# Patient Record
Sex: Female | Born: 1980 | Race: White | Hispanic: No | Marital: Married | Smoking: Never smoker
Health system: Southern US, Community
[De-identification: ages and names within clinical notes are randomized; demographics above are authoritative.]

## PROBLEM LIST (undated history)

## (undated) DIAGNOSIS — R1013 Epigastric pain: Secondary | ICD-10-CM

## (undated) DIAGNOSIS — K219 Gastro-esophageal reflux disease without esophagitis: Secondary | ICD-10-CM

## (undated) DIAGNOSIS — R079 Chest pain, unspecified: Secondary | ICD-10-CM

## (undated) DIAGNOSIS — R519 Headache, unspecified: Secondary | ICD-10-CM

## (undated) HISTORY — DX: Epigastric pain: R10.13

## (undated) HISTORY — DX: Gastro-esophageal reflux disease without esophagitis: K21.9

## (undated) HISTORY — DX: Headache, unspecified: R51.9

## (undated) HISTORY — DX: Chest pain, unspecified: R07.9

---

## 2000-10-15 ENCOUNTER — Other Ambulatory Visit: Admission: RE | Admit: 2000-10-15 | Discharge: 2000-10-15 | Payer: Self-pay | Admitting: Obstetrics and Gynecology

## 2001-11-16 ENCOUNTER — Other Ambulatory Visit: Admission: RE | Admit: 2001-11-16 | Discharge: 2001-11-16 | Payer: Self-pay | Admitting: Obstetrics and Gynecology

## 2003-02-01 ENCOUNTER — Other Ambulatory Visit: Admission: RE | Admit: 2003-02-01 | Discharge: 2003-02-01 | Payer: Self-pay | Admitting: Obstetrics and Gynecology

## 2004-05-24 ENCOUNTER — Other Ambulatory Visit: Admission: RE | Admit: 2004-05-24 | Discharge: 2004-05-24 | Payer: Self-pay | Admitting: Obstetrics and Gynecology

## 2004-06-24 ENCOUNTER — Ambulatory Visit (HOSPITAL_COMMUNITY): Admission: RE | Admit: 2004-06-24 | Discharge: 2004-06-24 | Payer: Self-pay | Admitting: Obstetrics and Gynecology

## 2005-04-08 ENCOUNTER — Inpatient Hospital Stay (HOSPITAL_COMMUNITY): Admission: AD | Admit: 2005-04-08 | Discharge: 2005-04-10 | Payer: Self-pay | Admitting: Obstetrics and Gynecology

## 2005-04-11 ENCOUNTER — Encounter: Admission: RE | Admit: 2005-04-11 | Discharge: 2005-04-22 | Payer: Self-pay | Admitting: Obstetrics and Gynecology

## 2005-05-20 ENCOUNTER — Other Ambulatory Visit: Admission: RE | Admit: 2005-05-20 | Discharge: 2005-05-20 | Payer: Self-pay | Admitting: Obstetrics and Gynecology

## 2007-01-02 ENCOUNTER — Inpatient Hospital Stay (HOSPITAL_COMMUNITY): Admission: AD | Admit: 2007-01-02 | Discharge: 2007-01-04 | Payer: Self-pay | Admitting: Obstetrics and Gynecology

## 2007-10-06 ENCOUNTER — Encounter: Admission: RE | Admit: 2007-10-06 | Discharge: 2007-10-06 | Payer: Self-pay | Admitting: Obstetrics and Gynecology

## 2009-04-04 ENCOUNTER — Inpatient Hospital Stay (HOSPITAL_COMMUNITY): Admission: AD | Admit: 2009-04-04 | Discharge: 2009-04-04 | Payer: Self-pay | Admitting: Obstetrics and Gynecology

## 2009-08-09 ENCOUNTER — Inpatient Hospital Stay (HOSPITAL_COMMUNITY): Admission: AD | Admit: 2009-08-09 | Discharge: 2009-08-12 | Payer: Self-pay | Admitting: Obstetrics and Gynecology

## 2010-05-20 LAB — CBC
HCT: 30.6 % — ABNORMAL LOW (ref 36.0–46.0)
Hemoglobin: 10.6 g/dL — ABNORMAL LOW (ref 12.0–15.0)
MCHC: 34.4 g/dL (ref 30.0–36.0)
RBC: 3.56 MIL/uL — ABNORMAL LOW (ref 3.87–5.11)
RBC: 4.22 MIL/uL (ref 3.87–5.11)
RDW: 13.8 % (ref 11.5–15.5)
WBC: 11.3 10*3/uL — ABNORMAL HIGH (ref 4.0–10.5)

## 2010-05-20 LAB — RPR: RPR Ser Ql: NONREACTIVE

## 2010-12-10 LAB — CBC
HCT: 40.4
Hemoglobin: 13.9
MCHC: 33.6
MCHC: 34.5
MCV: 86.2
RBC: 4.02
RDW: 13
WBC: 9.8

## 2010-12-10 LAB — RPR: RPR Ser Ql: NONREACTIVE

## 2012-11-26 ENCOUNTER — Ambulatory Visit
Admission: RE | Admit: 2012-11-26 | Discharge: 2012-11-26 | Disposition: A | Discharge status: Home / Self Care | Payer: BC Managed Care – PPO | Source: Ambulatory Visit | Attending: Obstetrics and Gynecology | Admitting: Obstetrics and Gynecology

## 2012-11-26 ENCOUNTER — Other Ambulatory Visit: Payer: Self-pay | Admitting: Obstetrics and Gynecology

## 2012-11-26 DIAGNOSIS — R928 Other abnormal and inconclusive findings on diagnostic imaging of breast: Secondary | ICD-10-CM

## 2015-03-14 ENCOUNTER — Other Ambulatory Visit (HOSPITAL_COMMUNITY): Payer: Self-pay | Admitting: Family Medicine

## 2015-03-14 DIAGNOSIS — R1011 Right upper quadrant pain: Secondary | ICD-10-CM

## 2015-04-04 ENCOUNTER — Ambulatory Visit (HOSPITAL_COMMUNITY)
Admission: RE | Admit: 2015-04-04 | Discharge: 2015-04-04 | Disposition: A | Discharge status: Home / Self Care | Payer: BLUE CROSS/BLUE SHIELD | Source: Ambulatory Visit | Attending: Family Medicine | Admitting: Family Medicine

## 2015-04-04 DIAGNOSIS — R1011 Right upper quadrant pain: Secondary | ICD-10-CM | POA: Diagnosis not present

## 2015-04-04 MED ORDER — TECHNETIUM TC 99M MEBROFENIN IV KIT
5.0000 | PACK | Freq: Once | INTRAVENOUS | Status: AC | PRN
  Administered 2015-04-04: 5 via INTRAVENOUS

## 2015-12-12 ENCOUNTER — Telehealth: Payer: Self-pay | Admitting: Genetic Counselor

## 2015-12-12 ENCOUNTER — Encounter: Payer: Self-pay | Admitting: Genetic Counselor

## 2015-12-12 NOTE — Telephone Encounter (Signed)
Pt cld wanting to set up an appt w/genetic counselor. Appt scheduled for 11/16 at 9am w/Kayla Boggs. Demographics verified. Pt aware to arrive 30 minutes early. Dr. Gaye AlkenMccombs office from [facility] will fax records. Letter mailed to the pt.

## 2016-01-17 ENCOUNTER — Ambulatory Visit (HOSPITAL_BASED_OUTPATIENT_CLINIC_OR_DEPARTMENT_OTHER): Payer: BLUE CROSS/BLUE SHIELD | Admitting: Genetic Counselor

## 2016-01-17 ENCOUNTER — Other Ambulatory Visit: Payer: BLUE CROSS/BLUE SHIELD

## 2016-01-17 DIAGNOSIS — Z803 Family history of malignant neoplasm of breast: Secondary | ICD-10-CM | POA: Diagnosis not present

## 2016-01-17 DIAGNOSIS — Z808 Family history of malignant neoplasm of other organs or systems: Secondary | ICD-10-CM

## 2016-01-17 DIAGNOSIS — Z1379 Encounter for other screening for genetic and chromosomal anomalies: Secondary | ICD-10-CM | POA: Diagnosis not present

## 2016-01-17 DIAGNOSIS — Z8049 Family history of malignant neoplasm of other genital organs: Secondary | ICD-10-CM

## 2016-01-17 DIAGNOSIS — Z800 Family history of malignant neoplasm of digestive organs: Secondary | ICD-10-CM

## 2016-01-17 DIAGNOSIS — Z8481 Family history of carrier of genetic disease: Secondary | ICD-10-CM | POA: Diagnosis not present

## 2016-01-17 DIAGNOSIS — Z801 Family history of malignant neoplasm of trachea, bronchus and lung: Secondary | ICD-10-CM

## 2016-01-17 DIAGNOSIS — Z8041 Family history of malignant neoplasm of ovary: Secondary | ICD-10-CM | POA: Diagnosis not present

## 2016-01-18 ENCOUNTER — Encounter: Payer: Self-pay | Admitting: Genetic Counselor

## 2016-01-18 NOTE — Progress Notes (Signed)
REFERRING PROVIDER: Arvella Nigh, MD [address], [place] [address]  PRIMARY PROVIDER:  Melinda Crutch, MD  PRIMARY REASON FOR VISIT:  1. Family history of BRCA1 gene positive   2. Family history of breast cancer in female   3. Family history of ovarian cancer   4. Family history of melanoma   5. Family history of colon cancer   6. Family history of cervical cancer   7. Family history of lung cancer      HISTORY OF PRESENT ILLNESS:   Rebecca Crawford. Rebecca Crawford, a 35 y.o. female, was seen for a [place] cancer genetics consultation at the request of Dr. Radene Knee due to a family history of a known pathogenic BRCA1 gene mutation and family history of breast, ovarian, and other cancers.  Rebecca Crawford presents to clinic today to discuss the possibility of a hereditary predisposition to cancer, genetic testing, and to further clarify her future cancer risks, as well as potential cancer risks for family members.    Rebecca Crawford is a 35 y.o. female with no personal history of cancer.     HORMONAL RISK FACTORS:  Menarche was at age 26.  First live birth at age [age].  OCP use for approximately 10 years.  Ovaries intact: yes.  Hysterectomy: no.  Menopausal status: premenopausal.  HRT use: used 3 or fewer cycles of clomid for two of her pregnancies. Colonoscopy: no; not examined. Mammogram within the last year: yes. Number of breast biopsies: 0. Up to date with pelvic exams:  yes. Any excessive radiation exposure/other exposures in the past:  no  History reviewed. No pertinent past medical history.  History reviewed. No pertinent surgical history.  Social History   Social History  . Marital status: Married    Spouse name: N/A  . Number of children: N/A  . Years of education: N/A   Social History Main Topics  . Smoking status: Never Smoker  . Smokeless tobacco: Never Used  . Alcohol use No  . Drug use: Unknown  . Sexual activity: Not Asked   Other Topics Concern  . None    Social History Narrative  . None     FAMILY HISTORY:  We obtained a detailed, 4-generation family history.  Significant diagnoses are listed below: Family History  Problem Relation Age of Onset  . Ovarian cancer Mother 79    fallopian tube ca; s/p TAH-BSO  . Other Mother     hx of "skin patches" removed from back, no reported skin cancers  . BRCA 1/2 Mother     BRCA1+  . Other Father     tested negative for paternal known familial BRCA1 mutation  . Mental illness Maternal Uncle   . Breast cancer Paternal Aunt 37  . BRCA 1/2 Paternal Aunt     BRCA1+ (different mutation than on maternal side of family)  . Cervical cancer Maternal Grandmother 46    s/p TAH-BSO  . Colon cancer Maternal Grandmother 2  . Melanoma Maternal Grandmother 76  . Basal cell carcinoma Maternal Grandmother     multiple  . Lung cancer Maternal Grandfather 29    lung/bronchus; +tobacco  . Breast cancer Paternal Grandmother 72  . BRCA 1/2 Paternal Grandmother     BRCA1+  . BRCA 1/2 Sister     BRCA1+  . Cancer Other     either bladder or prostate cancer; d. 90y  . Breast cancer Cousin 42    maternal 1st cousin, once-removed  . BRCA 1/2 Cousin  BRCA1+  . Other Cousin     hx of normal uterine biopsy  . Breast cancer Other     paternal great grandmother (PGM's mother); dx unspecied age    Rebecca Crawford has three daughters, ages 37-10 years.  She has three full sisters, ages 49-40.  Her youngest sister is the only one to have yet had genetic testing for the maternal known familial BRCA1 gene mutation, "[id number]"; she tested positive for this mutation.  Rebecca Crawford mother is currently 44.  She was diagnosed with cancer of the fallopian tube recently and underwent genetic testing via a 42-gene [facility] Common Hereditary Cancers Panel (Breast, Gyn, GI) in [place].  She was the first maternal family member found to have this pathogenic BRCA1 mutation, "[id number]".  Rebecca Crawford father is 15  and has never had cancer.  His sister was also previously found to have pathogenic BRCA1 gene mutation called "[id number]", and he has since tested negative for this mutation.  Rebecca Crawford mother has one full brother.  He has mental health issues, and their family is not currently in contact with him and they have no health information for him.  Rebecca Crawford maternal grandmother is currently 45.  She was diagnosed with cervical cancer at 39, with colon cancer at 78, and with melanoma at age 75.  She also has a history of multiple basal cell carcinomas.  She underwent a TAH-BSO at the time of her cervical cancer diagnosis.  Rebecca Crawford maternal grandmother has three full brothers, all of whom have passed away.  One brother had a history of either a bladder or prostate cancer and passed away at 73.  One brother died in a car accident at 71.  The third brother died of heart disease.  This last brother had a daughter who was diagnosed with breast cancer at 66.  Rebecca Crawford maternal grandfather was diagnosed with lung/bronchus cancer at 64; he was a tobacco user.  He passed away at 19.  He had two full sisters, neither of whom have had cancer to Rebecca Crawford's family's knowledge.    Rebecca Crawford father has a fraternal twin brother who has never had cancer.  His only sister, Darrick Penna, is currently 12 and was diagnosed with breast cancer at 22.  She has tested positive for the BRCA1 gene mutation.  She has one daughter and three sons.  Her daughter is the only one of her children to have had genetic testing yet, and she has also tested positive for this mutation.  Rebecca Crawford other paternal uncle is currently 41 and has not had cancer.  Rebecca Crawford paternal grandmother is currently 29.  She was diagnosed with breast cancer at 48 and has tested positive for the BRCA1 mutation.  She had one sister who was diagnosed with breast cancer in her 51s.  Her mother also had breast cancer.  Rebecca Crawford paternal  grandfather is currently 21 and has never had cancer.    Patient's maternal and paternal ancestors are of [place] descent. There is no reported Ashkenazi Jewish ancestry. There is no known consanguinity.  GENETIC COUNSELING ASSESSMENT: Rebecca Crawford is a 35 y.o. female with a maternal family history of a pathogenic BRCA1 gene mutation, "[id number]".  We, therefore, discussed and recommended the following at today's visit.   DISCUSSION: We reviewed the characteristics, features and inheritance pattern of hereditary breast and ovarian cancer syndrome. We discussed that Rebecca Crawford has a 50% of having inherited the maternal BRCA1 mutation.  On the  other hand, she could not have inherited the paternal family BRCA1 mutation, as her father tested negative for this gene mutation previously identified in his sister.  We also discussed genetic testing, including the appropriate family members to test, the process of testing, insurance coverage and turn-around-time for results. We discussed the implications of a negative, positive and/or variant of uncertain significant result. We discussed testing options through [facility].  [facility] offers free genetic testing to first-degree relatives' of probands who test positive for a pathogenic or likely pathogenic gene mutation within 90 days of the original family member's test report.  This testing includes analysis of the whole gene--(i.e. full analysis of BRCA1).  We also discussed testing via the 30-gene Hereditary Cancer Panel through [facility] for $60 (which includes analysis of BRCA1 plus 29 other genes).  Rebecca Crawford would like to proceed with free family testing through [facility].  Thus, we recommended Rebecca Crawford. Tauzin pursue sequencing and deletion/duplication analysis of the BRCA1 gene through [facility] [place], [place]).  Based on Rebecca Crawford. Behanna's family  history of cancer, she meets medical criteria for genetic testing. We discussed that she will have no cost for testing because she meets [facility] criteria for free family testing.  She is encouraged to call us if she receives a bill, as this bill will have been sent in error.   PLAN: After considering the risks, benefits, and limitations, Rebecca Crawford. Glynn  provided informed consent to pursue genetic testing and the blood sample was sent to [facility] for full gene analysis of the BRCA1 gene. Results should be available within approximately 2-3 weeks' time, at which point they will be disclosed by telephone to Rebecca Crawford. Gaillard, as will any additional recommendations warranted by these results. Rebecca Crawford. Handley will receive a summary of her genetic counseling visit and a copy of her results once available. This information will also be available in Epic. We encouraged Rebecca Crawford. Achey to remain in contact with cancer genetics annually so that we can continuously update the family history and inform her of any changes in cancer genetics and testing that may be of benefit for her family. Rebecca Crawford. Jolley questions were answered to her satisfaction today. Our contact information was provided should additional questions or concerns arise.  Thank you for the referral and allowing [place] to share in the care of your patient.   Jeanine Luz, Rebecca Crawford, [facility] Certified Genetic Counselor [email or web address] Phone: [phone]  The patient was seen for a total of 60 minutes in face-to-face genetic counseling.  This patient was discussed with Drs. Magrinat, Lindi Adie and/or Burr Medico who agrees with the above.    _______________________________________________________________________ For Office Staff:  Number of people involved in session: 2 Was an Intern/ student involved with case: no

## 2016-01-31 ENCOUNTER — Telehealth: Payer: Self-pay | Admitting: Genetic Counselor

## 2016-01-31 NOTE — Telephone Encounter (Signed)
Ms. Rebecca Crawford called to check if her results are back yet.  We ordered testing on 11/16, and they are not yet back.  Discussed that it can take up to 3 weeks.  If they are not back tomorrow, I will call the lab and get an estimated TAT from them and then I'll call her and let her know.

## 2016-02-01 ENCOUNTER — Telehealth: Payer: Self-pay | Admitting: Genetic Counselor

## 2016-02-01 NOTE — Telephone Encounter (Signed)
Followed up with Ms. Rebecca Crawford regarding when we might expect test results back.  Called [facility] and they let me know that those will be back by 12/11 at the latest, but possibly by 12/8.  She is welcome to call with any other questions.

## 2016-02-11 ENCOUNTER — Telehealth: Payer: Self-pay | Admitting: Genetic Counselor

## 2016-02-11 DIAGNOSIS — Z1329 Encounter for screening for other suspected endocrine disorder: Secondary | ICD-10-CM | POA: Diagnosis not present

## 2016-02-11 DIAGNOSIS — Z13220 Encounter for screening for lipoid disorders: Secondary | ICD-10-CM | POA: Diagnosis not present

## 2016-02-11 DIAGNOSIS — Z13228 Encounter for screening for other metabolic disorders: Secondary | ICD-10-CM | POA: Diagnosis not present

## 2016-02-11 DIAGNOSIS — Z1231 Encounter for screening mammogram for malignant neoplasm of breast: Secondary | ICD-10-CM | POA: Diagnosis not present

## 2016-02-11 DIAGNOSIS — Z8041 Family history of malignant neoplasm of ovary: Secondary | ICD-10-CM | POA: Diagnosis not present

## 2016-02-11 DIAGNOSIS — Z01419 Encounter for gynecological examination (general) (routine) without abnormal findings: Secondary | ICD-10-CM | POA: Diagnosis not present

## 2016-02-11 DIAGNOSIS — Z6829 Body mass index (BMI) 29.0-29.9, adult: Secondary | ICD-10-CM | POA: Diagnosis not present

## 2016-02-11 NOTE — Telephone Encounter (Signed)
Discussed that Rebecca Crawford's genetic test result was negative for the known familial pathogenic mutation previously identified in the BRCA1 gene for her mother and sister.  Discussed that we call this a true negative result and that her risks for BRCA1-related cancers are likely more similar to that of the general population.  Recommended that her two sisters and her other maternal relatives still undergo genetic testing.  Rebecca Crawford is very happy to receive this news--she has been very anxious about getting her test results back.

## 2016-02-12 ENCOUNTER — Ambulatory Visit: Payer: Self-pay | Admitting: Genetic Counselor

## 2016-02-12 DIAGNOSIS — Z1379 Encounter for other screening for genetic and chromosomal anomalies: Secondary | ICD-10-CM

## 2016-02-12 DIAGNOSIS — Z803 Family history of malignant neoplasm of breast: Secondary | ICD-10-CM

## 2016-02-12 DIAGNOSIS — Z8041 Family history of malignant neoplasm of ovary: Secondary | ICD-10-CM

## 2016-02-12 DIAGNOSIS — Z8481 Family history of carrier of genetic disease: Secondary | ICD-10-CM

## 2016-02-12 DIAGNOSIS — Z809 Family history of malignant neoplasm, unspecified: Secondary | ICD-10-CM

## 2016-02-18 DIAGNOSIS — Z1379 Encounter for other screening for genetic and chromosomal anomalies: Secondary | ICD-10-CM | POA: Insufficient documentation

## 2016-02-18 NOTE — Progress Notes (Signed)
GENETIC TEST RESULT  HPI: Rebecca Crawford was previously seen in the [place] clinic due to a family history of a known pathogenic BRCA1 gene mutation, family history of breast, ovarian, and other cancers, and concerns regarding a hereditary predisposition to cancer. Please refer to our prior cancer genetics clinic note from January 17, 2016 for more information regarding Rebecca Crawford's medical, social and family histories, and our assessment and recommendations, at the time. Rebecca Crawford recent genetic test results were disclosed to her, as were recommendations warranted by these results. These results and recommendations are discussed in more detail below.  GENETIC TEST RESULTS: Genetic testing reported out on February 10, 2016 through the full sequencing and deletion/duplicaton analysis of the BRCA1 found no deleterious mutations.  No variants of uncertain significance (VUSes) were found.  Testing was performed via [facility], [place]).  The test report will be scanned into EPIC and will be located under the Molecular Pathology section of the Results Review tab.   Our concern in recommending Rebecca Crawford pursue testing was due to the known familial pathogenic BRCA1 gene mutation called "[id number] ([id number])", that was previously identified in Rebecca Crawford's mother and sister. Rebecca Crawford test was normal and did not reveal the familial mutation. We call this result a true negative result because the cancer-causing mutation was identified in Rebecca Crawford's family, and she did not inherit it.  Given this negative result, Rebecca Crawford chances of developing BRCA1-related cancers are the same as they are in the general population.    CANCER SCREENING RECOMMENDATIONS: This normal result is reassuring and indicates that Rebecca Crawford does not likely have an increased risk of cancer due to a mutation in the BRCA1 gene.  We, therefore, recommended  Rebecca Crawford  continue to follow the cancer screening guidelines provided by her primary healthcare providers.   RECOMMENDATIONS FOR FAMILY MEMBERS: Women in this family might be at some increased risk of developing cancer, over the general population risk, simply due to the family history of cancer. We recommended women in this family have a yearly mammogram beginning at age 31, or 93 years younger than the earliest onset of cancer, an annual clinical breast exam, and perform monthly breast self-exams. Women in this family should also have a gynecological exam as recommended by their primary provider. Men in the family should discuss prostate cancer screening with their doctors.  All family members should have a colonoscopy by age 21.  Family members who test positive for the familial BRCA1 mutation are at a higher risk for certain cancers and will need to follow [place] (NCCN) guidelines for [employer].    Based on Rebecca Crawford's family history, we recommended her other two sisters and her other maternal relatives, who have not yet had genetic counseling and testing, do so. Rebecca Crawford will let us know if we can be of any assistance in coordinating genetic counseling and/or testing for these family members.   FOLLOW-UP: Lastly, we discussed with Rebecca Crawford that cancer genetics is a rapidly advancing field and it is possible that new genetic tests will be appropriate for her and/or her family members in the future. We encouraged her to remain in contact with cancer genetics on an annual basis so we can update her personal and family histories and let her know of advances in cancer genetics that may benefit this family.   Our contact number was provided. Rebecca Crawford questions were answered to her satisfaction, and she  knows she is welcome to call us at anytime with additional questions or concerns.   Jeanine Luz, MS, [facility] Certified Genetic  Counselor [email or web address] Phone: [phone]

## 2016-04-09 DIAGNOSIS — E559 Vitamin D deficiency, unspecified: Secondary | ICD-10-CM | POA: Diagnosis not present

## 2017-03-02 DIAGNOSIS — Z01419 Encounter for gynecological examination (general) (routine) without abnormal findings: Secondary | ICD-10-CM | POA: Diagnosis not present

## 2017-03-02 DIAGNOSIS — Z1231 Encounter for screening mammogram for malignant neoplasm of breast: Secondary | ICD-10-CM | POA: Diagnosis not present

## 2017-03-02 DIAGNOSIS — Z6831 Body mass index (BMI) 31.0-31.9, adult: Secondary | ICD-10-CM | POA: Diagnosis not present

## 2017-03-19 DIAGNOSIS — Z809 Family history of malignant neoplasm, unspecified: Secondary | ICD-10-CM | POA: Diagnosis not present

## 2017-03-19 DIAGNOSIS — Z8041 Family history of malignant neoplasm of ovary: Secondary | ICD-10-CM | POA: Diagnosis not present

## 2017-03-19 DIAGNOSIS — Z803 Family history of malignant neoplasm of breast: Secondary | ICD-10-CM | POA: Diagnosis not present

## 2017-06-02 DIAGNOSIS — M26621 Arthralgia of right temporomandibular joint: Secondary | ICD-10-CM | POA: Diagnosis not present

## 2017-08-21 ENCOUNTER — Ambulatory Visit
Admission: RE | Admit: 2017-08-21 | Discharge: 2017-08-21 | Disposition: A | Discharge status: Home / Self Care | Payer: BLUE CROSS/BLUE SHIELD | Source: Ambulatory Visit | Attending: Family Medicine | Admitting: Family Medicine

## 2017-08-21 ENCOUNTER — Other Ambulatory Visit: Payer: Self-pay | Admitting: Family Medicine

## 2017-08-21 DIAGNOSIS — M25579 Pain in unspecified ankle and joints of unspecified foot: Secondary | ICD-10-CM

## 2017-08-21 DIAGNOSIS — M25571 Pain in right ankle and joints of right foot: Secondary | ICD-10-CM | POA: Diagnosis not present

## 2017-08-21 DIAGNOSIS — S99911A Unspecified injury of right ankle, initial encounter: Secondary | ICD-10-CM | POA: Diagnosis not present

## 2018-01-05 DIAGNOSIS — F4323 Adjustment disorder with mixed anxiety and depressed mood: Secondary | ICD-10-CM | POA: Diagnosis not present

## 2018-01-13 DIAGNOSIS — F4323 Adjustment disorder with mixed anxiety and depressed mood: Secondary | ICD-10-CM | POA: Diagnosis not present

## 2018-01-20 DIAGNOSIS — F4323 Adjustment disorder with mixed anxiety and depressed mood: Secondary | ICD-10-CM | POA: Diagnosis not present

## 2018-01-26 DIAGNOSIS — F4323 Adjustment disorder with mixed anxiety and depressed mood: Secondary | ICD-10-CM | POA: Diagnosis not present

## 2018-02-04 DIAGNOSIS — E329 Disease of thymus, unspecified: Secondary | ICD-10-CM | POA: Diagnosis not present

## 2018-09-01 DIAGNOSIS — Z803 Family history of malignant neoplasm of breast: Secondary | ICD-10-CM | POA: Diagnosis not present

## 2018-09-01 DIAGNOSIS — Z6832 Body mass index (BMI) 32.0-32.9, adult: Secondary | ICD-10-CM | POA: Diagnosis not present

## 2018-09-01 DIAGNOSIS — Z13220 Encounter for screening for lipoid disorders: Secondary | ICD-10-CM | POA: Diagnosis not present

## 2018-09-01 DIAGNOSIS — Z1329 Encounter for screening for other suspected endocrine disorder: Secondary | ICD-10-CM | POA: Diagnosis not present

## 2018-09-01 DIAGNOSIS — Z8041 Family history of malignant neoplasm of ovary: Secondary | ICD-10-CM | POA: Diagnosis not present

## 2018-09-01 DIAGNOSIS — Z01419 Encounter for gynecological examination (general) (routine) without abnormal findings: Secondary | ICD-10-CM | POA: Diagnosis not present

## 2018-09-01 DIAGNOSIS — Z1231 Encounter for screening mammogram for malignant neoplasm of breast: Secondary | ICD-10-CM | POA: Diagnosis not present

## 2018-09-01 DIAGNOSIS — Z13228 Encounter for screening for other metabolic disorders: Secondary | ICD-10-CM | POA: Diagnosis not present

## 2018-09-09 DIAGNOSIS — Z8041 Family history of malignant neoplasm of ovary: Secondary | ICD-10-CM | POA: Diagnosis not present

## 2018-09-13 DIAGNOSIS — R194 Change in bowel habit: Secondary | ICD-10-CM | POA: Diagnosis not present

## 2018-09-13 DIAGNOSIS — R1084 Generalized abdominal pain: Secondary | ICD-10-CM | POA: Diagnosis not present

## 2018-09-13 DIAGNOSIS — Z713 Dietary counseling and surveillance: Secondary | ICD-10-CM | POA: Diagnosis not present

## 2018-09-14 ENCOUNTER — Encounter: Payer: Self-pay | Admitting: Gastroenterology

## 2018-09-29 DIAGNOSIS — N12 Tubulo-interstitial nephritis, not specified as acute or chronic: Secondary | ICD-10-CM | POA: Diagnosis not present

## 2018-10-06 DIAGNOSIS — Z20828 Contact with and (suspected) exposure to other viral communicable diseases: Secondary | ICD-10-CM | POA: Diagnosis not present

## 2018-10-12 ENCOUNTER — Emergency Department (HOSPITAL_BASED_OUTPATIENT_CLINIC_OR_DEPARTMENT_OTHER)
Admission: EM | Admit: 2018-10-12 | Discharge: 2018-10-12 | Disposition: A | Discharge status: Home / Self Care | Payer: BC Managed Care – PPO | Attending: Emergency Medicine | Admitting: Emergency Medicine

## 2018-10-12 ENCOUNTER — Emergency Department (HOSPITAL_COMMUNITY): Payer: BC Managed Care – PPO

## 2018-10-12 ENCOUNTER — Other Ambulatory Visit: Payer: Self-pay

## 2018-10-12 ENCOUNTER — Encounter (HOSPITAL_BASED_OUTPATIENT_CLINIC_OR_DEPARTMENT_OTHER): Payer: Self-pay | Admitting: *Deleted

## 2018-10-12 ENCOUNTER — Emergency Department (HOSPITAL_BASED_OUTPATIENT_CLINIC_OR_DEPARTMENT_OTHER): Payer: BC Managed Care – PPO

## 2018-10-12 DIAGNOSIS — R110 Nausea: Secondary | ICD-10-CM | POA: Diagnosis not present

## 2018-10-12 DIAGNOSIS — R42 Dizziness and giddiness: Secondary | ICD-10-CM | POA: Insufficient documentation

## 2018-10-12 LAB — BASIC METABOLIC PANEL
Anion gap: 11 (ref 5–15)
BUN: 8 mg/dL (ref 6–20)
CO2: 23 mmol/L (ref 22–32)
Calcium: 9.2 mg/dL (ref 8.9–10.3)
Chloride: 106 mmol/L (ref 98–111)
Creatinine, Ser: 0.67 mg/dL (ref 0.44–1.00)
GFR calc Af Amer: >60 mL/min (ref >60)
GFR calc non Af Amer: >60 mL/min (ref >60)
Glucose, Bld: 95 mg/dL (ref 70–99)
Potassium: 3.7 mmol/L (ref 3.5–5.1)
Sodium: 140 mmol/L (ref 135–145)

## 2018-10-12 LAB — CBC WITH DIFFERENTIAL/PLATELET
Abs Immature Granulocytes: 0.02 10*3/uL (ref 0.00–0.07)
Basophils Absolute: 0.1 10*3/uL (ref 0.0–0.1)
Basophils Relative: 1 %
Eosinophils Absolute: 0.1 10*3/uL (ref 0.0–0.5)
Eosinophils Relative: 1 %
HCT: 44.5 % (ref 36.0–46.0)
Hemoglobin: 15 g/dL (ref 12.0–15.0)
Immature Granulocytes: 0 %
Lymphocytes Relative: 27 %
Lymphs Abs: 2.1 10*3/uL (ref 0.7–4.0)
MCH: 29.1 pg (ref 26.0–34.0)
MCHC: 33.7 g/dL (ref 30.0–36.0)
MCV: 86.2 fL (ref 80.0–100.0)
Monocytes Absolute: 0.5 10*3/uL (ref 0.1–1.0)
Monocytes Relative: 7 %
Neutro Abs: 5 10*3/uL (ref 1.7–7.7)
Neutrophils Relative %: 64 %
Platelets: 313 10*3/uL (ref 150–400)
RBC: 5.16 MIL/uL — ABNORMAL HIGH (ref 3.87–5.11)
RDW: 12.4 % (ref 11.5–15.5)
WBC: 7.7 10*3/uL (ref 4.0–10.5)
nRBC: 0 % (ref 0.0–0.2)

## 2018-10-12 LAB — HCG, SERUM, QUALITATIVE: Preg, Serum: NEGATIVE

## 2018-10-12 MED ORDER — MECLIZINE HCL 25 MG PO TABS: 25.0000 mg | ORAL_TABLET | Freq: Three times a day (TID) | ORAL | 0 refills | Status: AC | PRN

## 2018-10-12 MED ORDER — ONDANSETRON HCL 4 MG/2ML IJ SOLN
4.0000 mg | Freq: Once | INTRAMUSCULAR | Status: AC
  Administered 2018-10-12: 4 mg via INTRAVENOUS
  Filled 2018-10-12: qty 2

## 2018-10-12 MED ORDER — MECLIZINE HCL 25 MG PO TABS
25.0000 mg | ORAL_TABLET | Freq: Once | ORAL | Status: AC
  Administered 2018-10-12: 25 mg via ORAL
  Filled 2018-10-12: qty 1

## 2018-10-12 MED ORDER — SODIUM CHLORIDE 0.9 % IV BOLUS
1000.0000 mL | Freq: Once | INTRAVENOUS | Status: AC
  Administered 2018-10-12: 14:00:00 1000 mL via INTRAVENOUS

## 2018-10-12 NOTE — Discharge Instructions (Signed)
You were seen in the emergency department today with vertigo type symptoms.  Your MRI was normal.  This is likely coming from your inner ear.  Please attempt to the maneuvers outlined in the discharge paperwork at home.  Take meclizine as needed for symptoms.  This medication does not prevent symptoms but is meant to be taken when you have them actively.  Her symptoms do not resolve in the next 1 to 2 days please call the ear nose and throat physician listed to schedule a follow-up appointment in the office for further evaluation and treatment.  Return to the emergency department with any new or suddenly worsening symptoms.

## 2018-10-12 NOTE — ED Notes (Signed)
This RN notified MRI that pt has arrived.

## 2018-10-12 NOTE — ED Notes (Signed)
Pt resting quietly at this time.  Husband at bedside.

## 2018-10-12 NOTE — ED Notes (Signed)
Ambulation trial in hallway with unsuccessful, pt reports dizziness and unsteady gait.  MD observed while ambulating and discussed plan with pt and husband.  To call hospitalist for consult and possible transfer to [facility] for MRI

## 2018-10-12 NOTE — ED Triage Notes (Signed)
Dizziness x 6 days. Nausea.

## 2018-10-12 NOTE — ED Notes (Signed)
Patient transported to CT 

## 2018-10-12 NOTE — ED Notes (Signed)
MD at bedside. 

## 2018-10-12 NOTE — ED Provider Notes (Signed)
Blood pressure 122/76, pulse 70, temperature 98.3 F (36.8 C), temperature source Oral, resp. rate 18, height 5\' 6"  (1.676 m), weight 86.2 kg, last menstrual period 10/11/2018, SpO2 100 %.  In short, Rebecca Crawford is a 38 y.o. female with a chief complaint of Dizziness .  Refer to the original H&P for additional details.  05:10 PM  Patient arrived from [place] for evaluation of dizziness.  Not responding to therapies in the ED and transferred for MRI.  Discussed testing and expected timeframe here in the emergency department.  Patient is in agreement with plan.  Will follow MRI results.   08:00 PM  MRI of the brain interpreted as normal.  Patient feeling improved.  Discharged home with meclizine, instructions on Epley maneuver, and ENT follow-up.  Patient comfortable with the plan at discharge.  Significant other at bedside for transport home.     Margette Fast, MD 10/12/18 2004

## 2018-10-12 NOTE — ED Provider Notes (Addendum)
[facility] EMERGENCY DEPARTMENT Provider Note   CSN: [id number] Arrival date & time: 10/12/18  1229     History   Chief Complaint Chief Complaint  Patient presents with  . Dizziness    HPI Rebecca Crawford is a 38 y.o. female.     Patient is a 38 year old female with no significant past medical history.  She presents today with complaints of dizziness.  This is been ongoing for approximately 1 week.  The patient describes a spinning sensation and feeling off balance when she changes position and turns her head.  She has a physician friend who has attempted the Epley maneuver, however her symptoms persist.  She denies headache or trauma.  She denies fevers or chills.  The history is provided by the patient.  Dizziness Quality:  Room spinning Severity:  Moderate Duration:  1 week Timing:  Intermittent Progression:  Worsening Chronicity:  New Context: head movement and standing up   Relieved by:  Nothing Worsened by:  Nothing Ineffective treatments:  None tried   History reviewed. No pertinent past medical history.  Patient Active Problem List   Diagnosis Date Noted  . Genetic testing 02/18/2016    History reviewed. No pertinent surgical history.   OB History   No obstetric history on file.      Home Medications    Prior to Admission medications   Not on File    Family History Family History  Problem Relation Age of Onset  . Ovarian cancer Mother 52       fallopian tube ca; s/p TAH-BSO  . Other Mother        hx of "skin patches" removed from back, no reported skin cancers  . BRCA 1/2 Mother        BRCA1+  . Other Father        tested negative for paternal known familial BRCA1 mutation  . Mental illness Maternal Uncle   . Breast cancer Paternal Aunt 36  . BRCA 1/2 Paternal Aunt        BRCA1+ (different mutation than on maternal side of family)  . Cervical cancer Maternal Grandmother 46       s/p TAH-BSO  . Colon cancer Maternal  Grandmother 63  . Melanoma Maternal Grandmother 32  . Basal cell carcinoma Maternal Grandmother        multiple  . Lung cancer Maternal Grandfather 36       lung/bronchus; +tobacco  . Breast cancer Paternal Grandmother 107  . BRCA 1/2 Paternal Grandmother        BRCA1+  . BRCA 1/2 Sister        BRCA1+  . Cancer Other        either bladder or prostate cancer; d. 90y  . Breast cancer Cousin 12       maternal 1st cousin, once-removed  . BRCA 1/2 Cousin        BRCA1+  . Other Cousin        hx of normal uterine biopsy  . Breast cancer Other        paternal great grandmother (PGM's mother); dx unspecied age    Social History Social History   Tobacco Use  . Smoking status: Never Smoker  . Smokeless tobacco: Never Used  Substance Use Topics  . Alcohol use: No  . Drug use: Not on file     Allergies   Patient has no known allergies.   Review of Systems Review of Systems  Neurological: Positive for  dizziness.  All other systems reviewed and are negative.    Physical Exam Updated Vital Signs BP 119/85   Pulse 69   Temp 98.3 F (36.8 C) (Oral)   Resp 20   Ht '5\' 6"'$  (1.676 m)   Wt 86.2 kg   LMP 10/11/2018   SpO2 100%   BMI 30.67 kg/m   Physical Exam Vitals signs and nursing note reviewed.  Constitutional:      General: She is not in acute distress.    Appearance: She is well-developed. She is not diaphoretic.  HENT:     Head: Normocephalic and atraumatic.     Right Ear: Tympanic membrane, ear canal and external ear normal. There is no impacted cerumen.     Left Ear: Tympanic membrane, ear canal and external ear normal. There is no impacted cerumen.  Eyes:     Extraocular Movements: Extraocular movements intact.     Pupils: Pupils are equal, round, and reactive to light.  Neck:     Musculoskeletal: Normal range of motion and neck supple.  Cardiovascular:     Rate and Rhythm: Normal rate and regular rhythm.     Heart sounds: No murmur. No friction rub. No  gallop.   Pulmonary:     Effort: Pulmonary effort is normal. No respiratory distress.     Breath sounds: Normal breath sounds. No wheezing.  Abdominal:     General: Bowel sounds are normal. There is no distension.     Palpations: Abdomen is soft.     Tenderness: There is no abdominal tenderness.  Musculoskeletal: Normal range of motion.  Skin:    General: Skin is warm and dry.  Neurological:     General: No focal deficit present.     Mental Status: She is alert and oriented to person, place, and time.     Cranial Nerves: No cranial nerve deficit.     Motor: No weakness.     Coordination: Coordination normal.      ED Treatments / Results  Labs (all labs ordered are listed, but only abnormal results are displayed) Labs Reviewed  BASIC METABOLIC PANEL  CBC WITH DIFFERENTIAL/PLATELET  HCG, SERUM, QUALITATIVE    EKG None  Radiology No results found.  Procedures Procedures (including critical care time)  Medications Ordered in ED Medications  sodium chloride 0.9 % bolus 1,000 mL (has no administration in time range)  ondansetron (ZOFRAN) injection 4 mg (has no administration in time range)  meclizine (ANTIVERT) tablet 25 mg (has no administration in time range)     Initial Impression / Assessment and Plan / ED Course  I have reviewed the triage vital signs and the nursing notes.  Pertinent labs & imaging results that were available during my care of the patient were reviewed by me and considered in my medical decision making (see chart for details).  Patient presenting here with complaints of dizziness for the past 5 to 6 days.  This is described as a spinning sensation, however is not improving.  Patient neurologic exam is nonfocal.  Work-up today reveals unremarkable laboratory studies and head CT which is negative.  Patient has been given IV fluids, meclizine, Zofran, however continues to be unsteady and off balance.  I attempted ambulation in the hall, however this  was poorly tolerated.  At this point, I feel as though patient should undergo an MRI to rule out posterior circulation CVA.  I do feel as though this is likely benign positional vertigo, however as she is not  improving an MRI is appropriate.  I have spoken with Dr. Ronnald Nian who agrees to accept in transfer.  Final Clinical Impressions(s) / ED Diagnoses   Final diagnoses:  None    ED Discharge Orders    None       Veryl Speak, MD 10/12/18 1504    Veryl Speak, MD 10/12/18 1505

## 2018-10-12 NOTE — ED Notes (Signed)
Pt reports dizziness and nausea for past week, seen by pmd, no improvement in symptoms. Sent here for further evaluation.  Pt reports feeling of spinning, which has been causing her to be nauseated with minimal movement.  Grips and leg movement equal bilat.

## 2018-10-12 NOTE — ED Notes (Signed)
Report called to Charge RN at [facility] ER to make aware of transfer over for MRI.

## 2018-10-14 ENCOUNTER — Encounter: Payer: Self-pay | Admitting: Emergency Medicine

## 2018-10-17 NOTE — Progress Notes (Deleted)
Referring Provider: Marda Stalker, PA-C Primary Care Physician:  Waldemar Dickens, MD  Reason for Consultation:  IBS   IMPRESSION:  ***  PLAN: ***  Please see the "Patient Instructions" section for addition details about the plan.  HPI: Rebecca Crawford is a 38 y.o. female   Prior abdominal imaging: RUQ ultrasound 03/12/15: normal HIDA: 04/04/15: Normal. GBEF 59% No prior endoscopy.   No known family history of colon cancer or polyps. No family history of uterine/endometrial cancer, pancreatic cancer or gastric/stomach cancer.   Past Medical History:  Diagnosis Date  . Chest pain   . Dyspepsia   . Esophageal reflux   . Headache     Past Surgical History:  Procedure Laterality Date  . wisdom tooth removal       Prior to Admission medications   Medication Sig Start Date End Date Taking? Authorizing Provider  meclizine (ANTIVERT) 25 MG tablet Take 1 tablet (25 mg total) by mouth 3 (three) times daily as needed for dizziness. 10/12/18   Long, Wonda Olds, MD    Current Outpatient Medications  Medication Sig Dispense Refill  . meclizine (ANTIVERT) 25 MG tablet Take 1 tablet (25 mg total) by mouth 3 (three) times daily as needed for dizziness. 30 tablet 0   No current facility-administered medications for this visit.     Allergies as of 10/18/2018  . (No Known Allergies)    Family History  Problem Relation Age of Onset  . Ovarian cancer Mother 7       fallopian tube ca; s/p TAH-BSO  . Other Mother        hx of "skin patches" removed from back, no reported skin cancers  . BRCA 1/2 Mother        BRCA1+  . Other Father        tested negative for paternal known familial BRCA1 mutation  . Mental illness Maternal Uncle   . Breast cancer Paternal Aunt 45  . BRCA 1/2 Paternal Aunt        BRCA1+ (different mutation than on maternal side of family)  . Cervical cancer Maternal Grandmother 46       s/p TAH-BSO  . Colon cancer Maternal Grandmother 26  . Melanoma  Maternal Grandmother 78  . Basal cell carcinoma Maternal Grandmother        multiple  . Lung cancer Maternal Grandfather 75       lung/bronchus; +tobacco  . Breast cancer Paternal Grandmother 30  . BRCA 1/2 Paternal Grandmother        BRCA1+  . BRCA 1/2 Sister        BRCA1+  . Cancer Other        either bladder or prostate cancer; d. 90y  . Breast cancer Cousin 51       maternal 1st cousin, once-removed  . BRCA 1/2 Cousin        BRCA1+  . Other Cousin        hx of normal uterine biopsy  . Breast cancer Other        paternal great grandmother (PGM's mother); dx unspecied age    Social History   Socioeconomic History  . Marital status: Married    Spouse name: Not on file  . Number of children: Not on file  . Years of education: Not on file  . Highest education level: Not on file  Occupational History  . Not on file  Social Needs  . Financial resource strain: Not on file  .  Food insecurity    Worry: Not on file    Inability: Not on file  . Transportation needs    Medical: Not on file    Non-medical: Not on file  Tobacco Use  . Smoking status: Never Smoker  . Smokeless tobacco: Never Used  Substance and Sexual Activity  . Alcohol use: No  . Drug use: Not on file  . Sexual activity: Not on file  Lifestyle  . Physical activity    Days per week: Not on file    Minutes per session: Not on file  . Stress: Not on file  Relationships  . Social Herbalist on phone: Not on file    Gets together: Not on file    Attends religious service: Not on file    Active member of club or organization: Not on file    Attends meetings of clubs or organizations: Not on file    Relationship status: Not on file  . Intimate partner violence    Fear of current or ex partner: Not on file    Emotionally abused: Not on file    Physically abused: Not on file    Forced sexual activity: Not on file  Other Topics Concern  . Not on file  Social History Narrative  . Not on file     Review of Systems: 12 system ROS is negative except as noted above.   Physical Exam: General:   Alert,  well-nourished, pleasant and cooperative in NAD Head:  Normocephalic and atraumatic. Eyes:  Sclera clear, no icterus.   Conjunctiva pink. Ears:  Normal auditory acuity. Nose:  No deformity, discharge,  or lesions. Mouth:  No deformity or lesions.   Neck:  Supple; no masses or thyromegaly. Lungs:  Clear throughout to auscultation.   No wheezes. Heart:  Regular rate and rhythm; no murmurs. Abdomen:  Soft,nontender, nondistended, normal bowel sounds, no rebound or guarding. No hepatosplenomegaly.   Rectal:  Deferred  Msk:  Symmetrical. No boney deformities LAD: No inguinal or umbilical LAD Extremities:  No clubbing or edema. Neurologic:  Alert and  oriented x4;  grossly nonfocal Skin:  Intact without significant lesions or rashes. Psych:  Alert and cooperative. Normal mood and affect.      Nesiah Jump L. Tarri Glenn, MD, MPH 10/17/2018, 9:15 PM

## 2018-10-18 ENCOUNTER — Ambulatory Visit: Payer: BLUE CROSS/BLUE SHIELD | Admitting: Gastroenterology

## 2018-11-02 ENCOUNTER — Ambulatory Visit: Payer: BLUE CROSS/BLUE SHIELD | Admitting: Registered"

## 2018-11-25 NOTE — Progress Notes (Signed)
Referring Provider: Marda Stalker, PA-C Primary Care Physician:  Waldemar Dickens, MD  Reason for Consultation: "I am trying to find out what's happening with my stomach"   IMPRESSION:  Abdominal pain with associated bloating    - developed in 2017    - associated with burning in her chest,  postprandial diarrhea and cramping    - negative ultrasound and HIDA  2017    - normal TTGA and IgA in 2017 Constipation with sense of incomplete evacuation Altered bowel habits Rectal bleeding x 1  Likely IBS. [place] IVcriteria for irritable bowel syndrome. At this time will check for IBS masqueraders including celiac disease, lactose/fructose/gluten intolerance, IBD, SIBO, thyroid disorder.    PLAN: Trial of FDGard Fecal calprotectin, ESR, CRP H pylori stool antigen, Giardia CT abd/pelvis with contrast [facility] [place] - obtain all blood work from 2020 Consultation with [facility] dietician as previously planned Follow-up in 4-6 weeks of earlier as needed  Please see the "Patient Instructions" section for addition details about the plan.  HPI: Rebecca Crawford is a 38 y.o. female referred by PA [facility] for further evaluation of abdominal pain, change in bowel habits and dietary consultation.  The history is obtained through the patient and review of her referral records.  She was previously followed by Dr. Michail Sermon at [facility].  She carries a history of IBS, esophageal reflux, dyspepsia, and headache.  Developed severe right upper quadrant abdominal cramping that radiated to her right lower back and right shoulder in January 2017.  Pain is frequently associated with burning in her chest,  postprandial diarrhea and cramping.  Also with constipation sometimes up to 4 to 5 days between bowel movements.  Frequent bloating but persists despite defecation.  She often strains to defecate and has a sense of incomplete evacuation.  No identified triggers.  She  had an ultrasound and HIDA scan that were nondiagnostic.  Previously evaluated by gynecology with an ultrasound and exam every 6 months given her family history. However, she has never discussed these symptoms with him. Normal TSH.  She is previously treated with hyoscyamine and proton pump inhibitor therapy. She did not fill the prescriptions because she didn't feel very heard during her consultation appointment.  "Digestive pain." Starts in the epigastrium.  Radiates to the right back in an area that she considers her kidneys. Then has acid reflux if she lays on her left. Lying on her right side triggers the pain.   Bowel habits have not been normal for a couple of years. Has gone as much as 8 days between bowel movements. Or she will have post-prandial defecation. She is frustrated that there is no "in the middle."  Using the famotidine.   Bloating. Stays full. Early satiety.   Reports profound borborygmi. Unable to identify trigger foods. Would go 8 days without a BM followed by diarrhea.  No mucous. Rectal bleeding x 1.   Has been using famotidine 40 mg nightly starting in July. Trying to watch her diet. Removed all dairy x 6 months without significant change. Minimizing white grains and bread. Tries to drink 100 ounces of water daily. Frustrated that she has made changes without improvement. Kept a food diarrhea in 2017 but only for a couple of months.  Appetite is overall good. Frustrated by weight increase.    She thinks in 2017 she had a gallbladder attack that was not detected at the time. A friend has had similar problems.  Smooth moove tea is no BM on day 3. Does not like to take medications. Would prefer natural therapies. But, she feels like she is using the smooth move tea too much.   She is concerned about a blockake that builds up and is only relieved only after drinking more water and tea.   Dr. Essie Hart has given her multiple possible diagnoses. In the back of her mind she  is worried that it's something else.  She wants to find the cause, not treat the symptoms.   Feels feverish without a fever when the pain is bad.   Testing with BRCA was negative. Mother with ovarian. Maternal GM with uterine and colon cancer x 2.  No other known family history of colon cancer or polyps. No family history of uterine/endometrial cancer, pancreatic cancer or gastric/stomach cancer.  Recent labs: 03/12/2015: Normal liver enzymes, albumin 4.3, hemoglobin 15, MCV 87.2, RDW 12.6 05/21/2015: IgA 220, TTGA less than 2  Prior abdominal imaging: HIDA with [facility] 04/04/15: normal exam; GBEF 59%  Past Medical History:  Diagnosis Date  . Chest pain   . Dyspepsia   . Esophageal reflux   . Headache     Past Surgical History:  Procedure Laterality Date  . wisdom tooth removal       Current Outpatient Medications  Medication Sig Dispense Refill  . meclizine (ANTIVERT) 25 MG tablet Take 1 tablet (25 mg total) by mouth 3 (three) times daily as needed for dizziness. 30 tablet 0   No current facility-administered medications for this visit.     Allergies as of 11/26/2018  . (No Known Allergies)    Family History  Problem Relation Age of Onset  . Ovarian cancer Mother 72       fallopian tube ca; s/p TAH-BSO  . Other Mother        hx of "skin patches" removed from back, no reported skin cancers  . BRCA 1/2 Mother        BRCA1+  . Other Father        tested negative for paternal known familial BRCA1 mutation  . Mental illness Maternal Uncle   . Breast cancer Paternal Aunt 4  . BRCA 1/2 Paternal Aunt        BRCA1+ (different mutation than on maternal side of family)  . Cervical cancer Maternal Grandmother 46       s/p TAH-BSO  . Colon cancer Maternal Grandmother 94  . Melanoma Maternal Grandmother 44  . Basal cell carcinoma Maternal Grandmother        multiple  . Lung cancer Maternal Grandfather 38       lung/bronchus; +tobacco  . Breast cancer Paternal Grandmother 39  .  BRCA 1/2 Paternal Grandmother        BRCA1+  . BRCA 1/2 Sister        BRCA1+  . Cancer Other        either bladder or prostate cancer; d. 90y  . Breast cancer Cousin 77       maternal 1st cousin, once-removed  . BRCA 1/2 Cousin        BRCA1+  . Other Cousin        hx of normal uterine biopsy  . Breast cancer Other        paternal great grandmother (PGM's mother); dx unspecied age    Social History   Socioeconomic History  . Marital status: Married    Spouse name: Not on file  . Number of children: Not  on file  . Years of education: Not on file  . Highest education level: Not on file  Occupational History  . Not on file  Social Needs  . Financial resource strain: Not on file  . Food insecurity    Worry: Not on file    Inability: Not on file  . Transportation needs    Medical: Not on file    Non-medical: Not on file  Tobacco Use  . Smoking status: Never Smoker  . Smokeless tobacco: Never Used  Substance and Sexual Activity  . Alcohol use: No  . Drug use: Not on file  . Sexual activity: Not on file  Lifestyle  . Physical activity    Days per week: Not on file    Minutes per session: Not on file  . Stress: Not on file  Relationships  . Social Herbalist on phone: Not on file    Gets together: Not on file    Attends religious service: Not on file    Active member of club or organization: Not on file    Attends meetings of clubs or organizations: Not on file    Relationship status: Not on file  . Intimate partner violence    Fear of current or ex partner: Not on file    Emotionally abused: Not on file    Physically abused: Not on file    Forced sexual activity: Not on file  Other Topics Concern  . Not on file  Social History Narrative  . Not on file    Review of Systems: 12 system ROS is negative except as noted above except for back pain, muscle pains, and insomnia.   Physical Exam: Physical Exam: General:   Alert,  well-nourished, pleasant  and cooperative in NAD.  Eyes are open. Head:  Normocephalic and atraumatic. Eyes:  Sclera clear, no icterus.   Conjunctiva pink. Ears:  Normal auditory acuity. Nose:  No deformity, discharge,  or lesions. Mouth:  No deformity or lesions.   Neck:  Supple; no masses or thyromegaly. Lungs:  Clear throughout to auscultation.   No wheezes. Heart:  Regular rate and rhythm; no murmurs. Abdomen:  Soft, mild tenderness with deep palpation in the RLQ,  nondistended, normal bowel sounds, no rebound or guarding. No hepatosplenomegaly.  No obvious ascites.  No abdominal wall hernias.  No succession splash. Rectal:  Deferred  Msk:  Symmetrical. No boney deformities LAD: No inguinal or umbilical LAD Extremities:  No clubbing or edema. Neurologic:  Alert and  oriented x4;  grossly nonfocal Skin:  Intact without significant lesions or rashes. Psych:  Alert and cooperative. Normal mood and affect.     Teeghan Hammer L. Tarri Glenn, MD, MPH 11/25/2018, 5:24 PM

## 2018-11-26 ENCOUNTER — Encounter: Payer: Self-pay | Admitting: Gastroenterology

## 2018-11-26 ENCOUNTER — Ambulatory Visit (INDEPENDENT_AMBULATORY_CARE_PROVIDER_SITE_OTHER): Payer: BC Managed Care – PPO | Admitting: Gastroenterology

## 2018-11-26 ENCOUNTER — Other Ambulatory Visit (INDEPENDENT_AMBULATORY_CARE_PROVIDER_SITE_OTHER): Payer: BC Managed Care – PPO

## 2018-11-26 DIAGNOSIS — R194 Change in bowel habit: Secondary | ICD-10-CM | POA: Diagnosis not present

## 2018-11-26 LAB — C-REACTIVE PROTEIN: CRP: <1 mg/dL (ref 0.5–20.0)

## 2018-11-26 LAB — SEDIMENTATION RATE: Sed Rate: 12 mm/hr (ref 0–20)

## 2018-11-26 NOTE — Patient Instructions (Addendum)
I agree with seeing a dietician. We will send another referral for you.   I have recommended some stool and lab tests to look for causes of your abdominal pain.  We will proceed with a CT scan of the abd/pelvis for further evaluation.  I have recommended a trial of FDGard.   Follow-up in 4-6 weeks or earlier, as needed.   You have been scheduled for a CT scan of the abdomen and pelvis at [facility] ([address]---this is in the same building as [facility]).   You are scheduled on 12-20-2018 at 3:30 pm. You should arrive 15 minutes prior to your appointment time for registration. Please follow the written instructions below on the day of your exam:  WARNING: IF YOU ARE ALLERGIC TO IODINE/X-RAY DYE, PLEASE NOTIFY RADIOLOGY IMMEDIATELY AT [phone]! YOU WILL BE GIVEN A 13 HOUR PREMEDICATION PREP.  1) Do not eat or drink anything after 11:30 am (4 hours prior to your test) 2) You have been given 2 bottles of oral contrast to drink. The solution may taste better if refrigerated, but do NOT add ice or any other liquid to this solution. Shake well before drinking.    Drink 1 bottle of contrast @ 1:30 pm (2 hours prior to your exam)  Drink 1 bottle of contrast @ 2:30 pm (1 hour prior to your exam)  You may take any medications as prescribed with a small amount of water, if necessary. If you take any of the following medications: METFORMIN, GLUCOPHAGE, GLUCOVANCE, AVANDAMET, RIOMET, FORTAMET, Walker MET, JANUMET, GLUMETZA or METAGLIP, you MAY be asked to HOLD this medication 48 hours AFTER the exam.  The purpose of you drinking the oral contrast is to aid in the visualization of your intestinal tract. The contrast solution may cause some diarrhea. Depending on your individual set of symptoms, you may also receive an intravenous injection of x-ray contrast/dye. Plan on being at [facility] for 30 minutes or longer, depending on the type of exam you are having  performed.  This test typically takes 30-45 minutes to complete.  If you have any questions regarding your exam or if you need to reschedule, you may call the CT department at [phone] between the hours of 8:00 am and 5:00 pm, Monday-Friday.  ________________________________________________________________________

## 2018-11-29 ENCOUNTER — Encounter: Payer: Self-pay | Admitting: *Deleted

## 2018-11-29 ENCOUNTER — Other Ambulatory Visit: Payer: BC Managed Care – PPO

## 2018-11-29 DIAGNOSIS — R194 Change in bowel habit: Secondary | ICD-10-CM

## 2018-11-30 LAB — HELICOBACTER PYLORI  SPECIAL ANTIGEN
MICRO NUMBER:: 928925
SPECIMEN QUALITY: ADEQUATE

## 2018-11-30 LAB — GIARDIA ANTIGEN
MICRO NUMBER:: 929253
RESULT:: NOT DETECTED
SPECIMEN QUALITY:: ADEQUATE

## 2018-12-08 LAB — CALPROTECTIN, FECAL: Calprotectin, Fecal: 21 ug/g (ref 0–120)

## 2018-12-09 ENCOUNTER — Encounter: Payer: Self-pay | Admitting: *Deleted

## 2018-12-20 ENCOUNTER — Ambulatory Visit (INDEPENDENT_AMBULATORY_CARE_PROVIDER_SITE_OTHER)
Admission: RE | Admit: 2018-12-20 | Discharge: 2018-12-20 | Disposition: A | Discharge status: Home / Self Care | Payer: BC Managed Care – PPO | Source: Ambulatory Visit | Attending: Gastroenterology | Admitting: Gastroenterology

## 2018-12-20 ENCOUNTER — Other Ambulatory Visit: Payer: Self-pay

## 2018-12-20 ENCOUNTER — Encounter: Payer: Self-pay | Admitting: *Deleted

## 2018-12-20 DIAGNOSIS — R194 Change in bowel habit: Secondary | ICD-10-CM

## 2018-12-20 DIAGNOSIS — K5900 Constipation, unspecified: Secondary | ICD-10-CM | POA: Diagnosis not present

## 2018-12-20 MED ORDER — IOHEXOL 300 MG/ML  SOLN
100.0000 mL | Freq: Once | INTRAMUSCULAR | Status: AC | PRN
  Administered 2018-12-20: 16:00:00 100 mL via INTRAVENOUS

## 2018-12-22 ENCOUNTER — Ambulatory Visit: Payer: BC Managed Care – PPO | Admitting: Gastroenterology

## 2019-01-10 DIAGNOSIS — Z9189 Other specified personal risk factors, not elsewhere classified: Secondary | ICD-10-CM | POA: Diagnosis not present

## 2019-01-12 ENCOUNTER — Other Ambulatory Visit: Payer: Self-pay | Admitting: Obstetrics and Gynecology

## 2019-01-12 DIAGNOSIS — Z9189 Other specified personal risk factors, not elsewhere classified: Secondary | ICD-10-CM

## 2019-08-30 IMAGING — MR MRI HEAD WITHOUT CONTRAST
10 of 11 series · 43 of 48 positions shown · non-contrast
Comparison: Head CT earlier today.

CLINICAL DATA: 38-year-old female with dizziness x6 days.  Nausea.

EXAM:
MRI HEAD WITHOUT CONTRAST
TECHNIQUE: Multiplanar, multiecho pulse sequences of the brain and surrounding
structures were obtained without intravenous contrast.

[Series 5: DWI · axial · 3.0mm · 0.88mm/px · z∈[-33,+105]mm · 10 of 94 slices shown (1 of 4)]
[im 1/94]
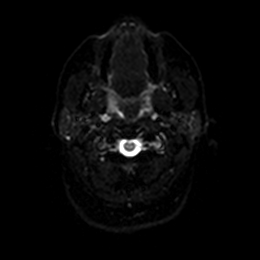
[im 11/94]
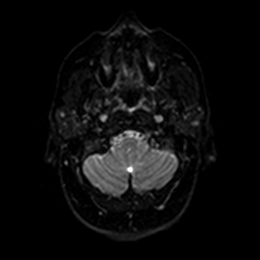
[im 21/94]
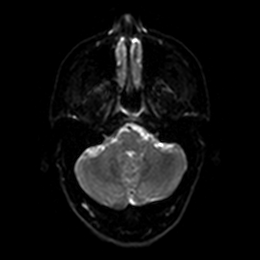
[im 32/94]
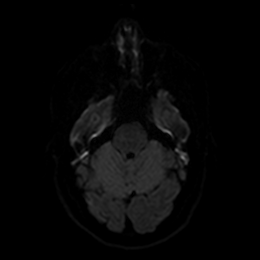
[im 42/94]
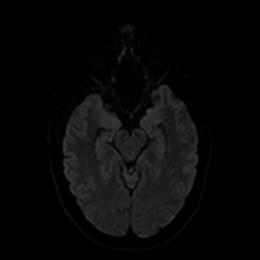
[im 52/94]
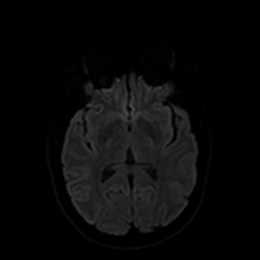
[im 63/94]
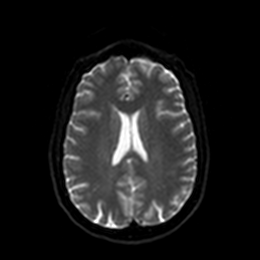
[im 73/94]
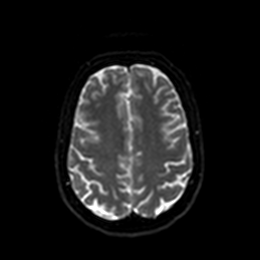
[im 83/94]
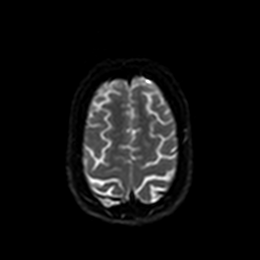
[im 94/94]
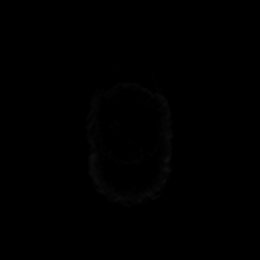

[Series 6: DWI · axial · 3.0mm · 0.88mm/px · z∈[-33,+105]mm · 5 of 47 slices shown (2 of 4)]
[im 1/47]
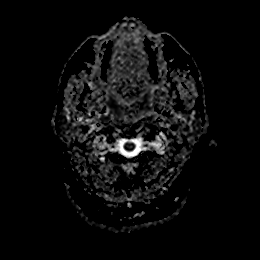
[im 12/47]
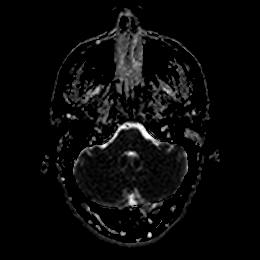
[im 24/47]
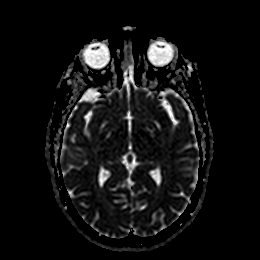
[im 35/47]
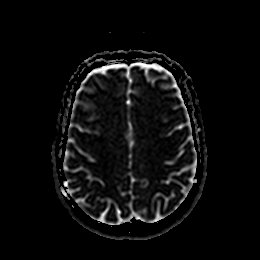
[im 47/47]
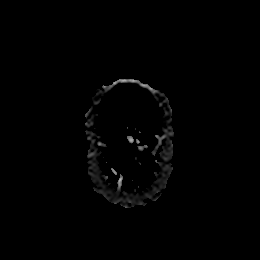

[Series 7: DWI · coronal · 4.0mm · 0.88mm/px · 6 of 70 slices shown (3 of 4)]
[im 1/70]
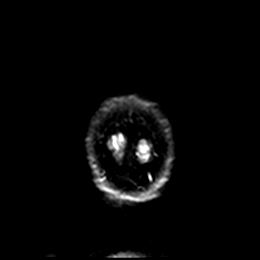
[im 14/70]
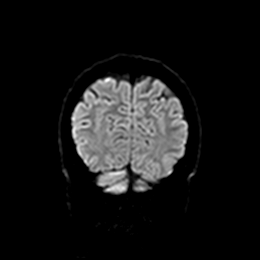
[im 28/70]
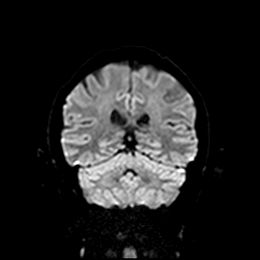
[im 42/70]
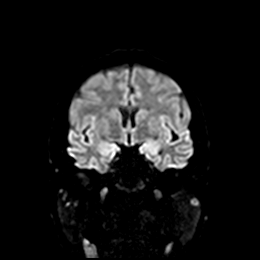
[im 56/70]
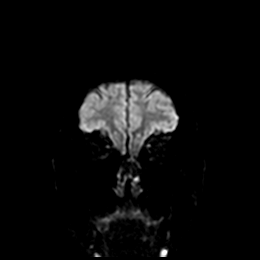
[im 70/70]
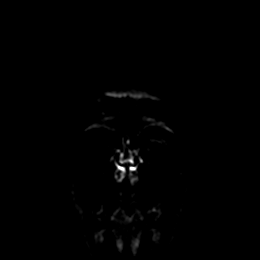

[Series 8: DWI · coronal · 4.0mm · 0.88mm/px · 3 of 35 slices shown (4 of 4)]
[im 1/35]
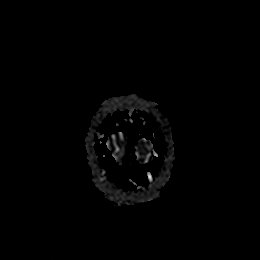
[im 18/35]
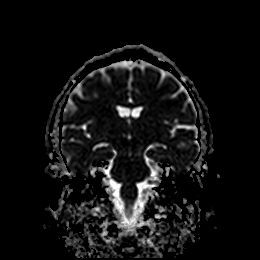
[im 35/35]
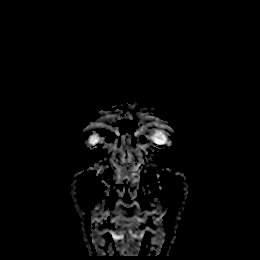

[Series 9: T1 · sagittal · 5.0mm · 0.75mm/px · 2 of 23 slices shown]
[im 1/23]
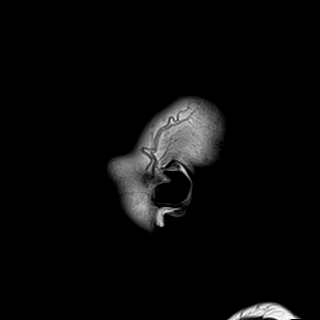
[im 23/23]
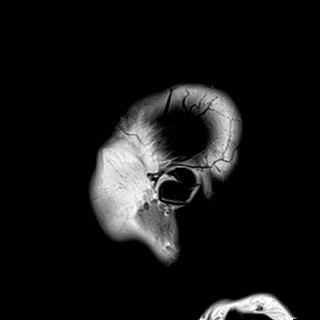

[Series 10: T2 · axial · 5.0mm · 0.72mm/px · z∈[-32,+112]mm · 2 of 25 slices shown]
[im 1/25]
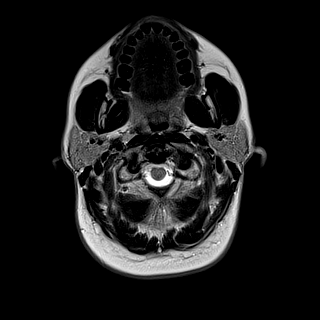
[im 25/25]
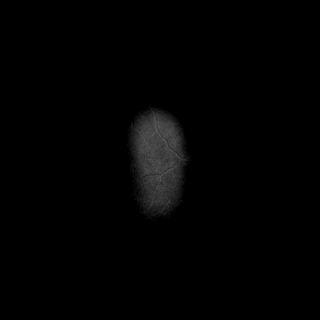

[Series 11: FLAIR · axial · 5.0mm · 0.45mm/px · z∈[-33,+111]mm · 2 of 25 slices shown]
[im 1/25]
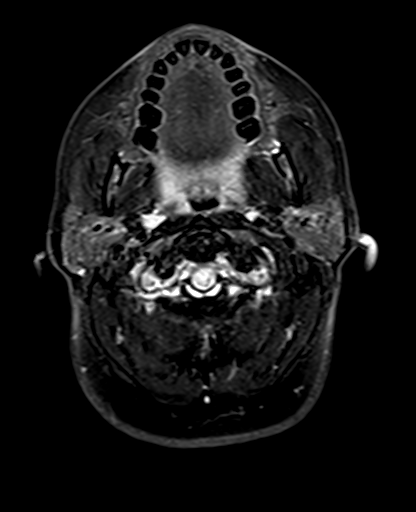
[im 25/25]
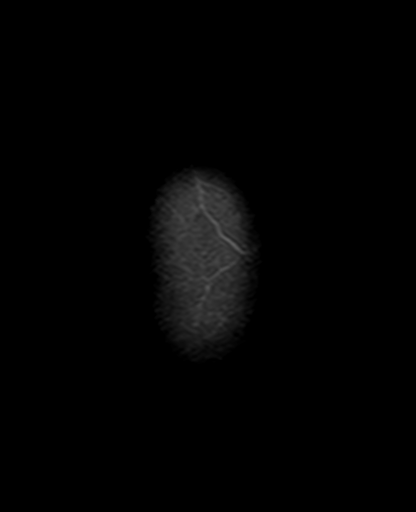

[Series 13: pha_images · axial · 3.0mm · 0.90mm/px · z∈[-44,+130]mm · 5 of 57 slices shown]
[im 1/57]
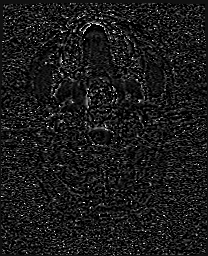
[im 15/57]
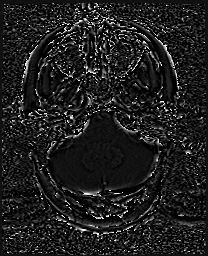
[im 29/57]
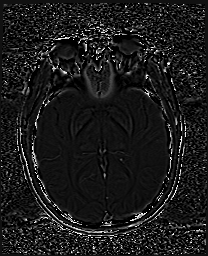
[im 43/57]
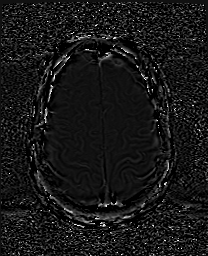
[im 57/57]
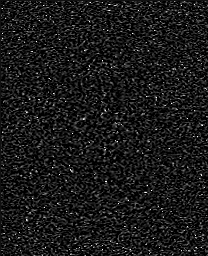

[Series 14: swi_images · axial · 3.0mm · 0.90mm/px · z∈[-44,+133]mm · 5 of 60 slices shown]
[im 1/60]
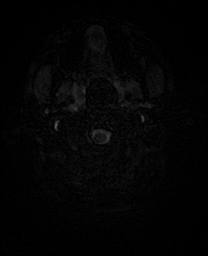
[im 15/60]
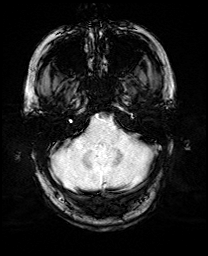
[im 30/60]
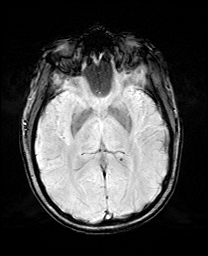
[im 45/60]
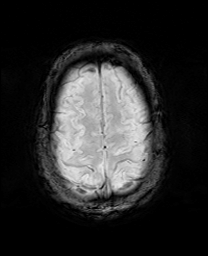
[im 60/60]
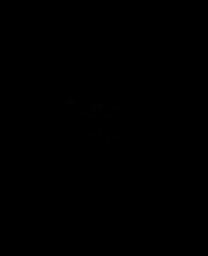

[Series 17: T2 post-contrast · coronal · 5.0mm · 0.72mm/px · 3 of 28 slices shown]
[im 1/28]
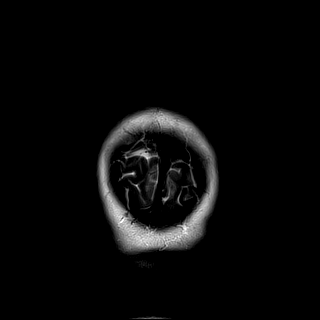
[im 14/28]
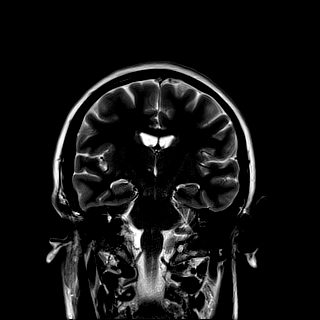
[im 28/28]
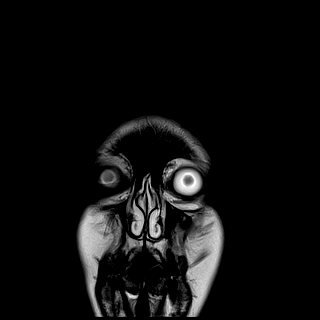

[43 of 48 positions shown; findings below may reference images not displayed]

FINDINGS: Brain: No restricted diffusion to suggest acute infarction. No
midline shift, mass effect, evidence of mass lesion,
ventriculomegaly, extra-axial collection or acute intracranial
hemorrhage. Cervicomedullary junction and pituitary are within
normal limits.

Gray and white matter signal is within normal limits throughout the
brain. No encephalomalacia or chronic blood products identified.
Normal brainstem and cerebellum.

Vascular: Major intracranial vascular flow voids are preserved.

Skull and upper cervical spine: Negative visible cervical spine.
Visualized bone marrow signal is within normal limits.

Sinuses/Orbits: Negative orbits. Trace paranasal sinus mucosal
thickening.

Other: Visible internal auditory structures appear normal. Mastoids
are clear. Stylomastoid foramina appear normal. Scalp and face soft
tissues are negative.
IMPRESSION: Normal MRI appearance of the brain.
